# Patient Record
Sex: Male | Born: 1965 | Race: White | Hispanic: No | Smoking: Current every day smoker
Health system: Southern US, Community
[De-identification: ages and names within clinical notes are randomized; demographics above are authoritative.]

## PROBLEM LIST (undated history)

## (undated) DIAGNOSIS — F329 Major depressive disorder, single episode, unspecified: Secondary | ICD-10-CM

## (undated) DIAGNOSIS — F32A Depression, unspecified: Secondary | ICD-10-CM

## (undated) DIAGNOSIS — F319 Bipolar disorder, unspecified: Secondary | ICD-10-CM

## (undated) DIAGNOSIS — E785 Hyperlipidemia, unspecified: Secondary | ICD-10-CM

---

## 1999-09-10 ENCOUNTER — Emergency Department (HOSPITAL_COMMUNITY): Admission: EM | Admit: 1999-09-10 | Discharge: 1999-09-10 | Payer: Self-pay | Admitting: Emergency Medicine

## 2007-02-03 ENCOUNTER — Emergency Department (HOSPITAL_COMMUNITY): Admission: EM | Admit: 2007-02-03 | Discharge: 2007-02-03 | Payer: Self-pay | Admitting: Emergency Medicine

## 2008-07-31 ENCOUNTER — Inpatient Hospital Stay (HOSPITAL_COMMUNITY): Admission: AD | Admit: 2008-07-31 | Discharge: 2008-08-29 | Payer: Self-pay | Admitting: Psychiatry

## 2008-07-31 ENCOUNTER — Emergency Department (HOSPITAL_COMMUNITY): Admission: EM | Admit: 2008-07-31 | Discharge: 2008-07-31 | Payer: Self-pay | Admitting: Emergency Medicine

## 2008-07-31 ENCOUNTER — Ambulatory Visit: Payer: Self-pay | Admitting: Psychiatry

## 2009-07-10 ENCOUNTER — Emergency Department (HOSPITAL_COMMUNITY): Admission: EM | Admit: 2009-07-10 | Discharge: 2009-07-10 | Payer: Self-pay | Admitting: Emergency Medicine

## 2009-07-10 ENCOUNTER — Inpatient Hospital Stay (HOSPITAL_COMMUNITY): Admission: AD | Admit: 2009-07-10 | Discharge: 2009-07-21 | Payer: Self-pay | Admitting: Psychiatry

## 2009-07-10 ENCOUNTER — Ambulatory Visit: Payer: Self-pay | Admitting: Psychiatry

## 2009-08-31 ENCOUNTER — Emergency Department: Payer: Self-pay | Admitting: Emergency Medicine

## 2010-12-23 LAB — COMPREHENSIVE METABOLIC PANEL
ALT: 14 U/L (ref 0–53)
AST: 19 U/L (ref 0–37)
Albumin: 4.3 g/dL (ref 3.5–5.2)
CO2: 28 mEq/L (ref 19–32)
Chloride: 103 mEq/L (ref 96–112)
GFR calc Af Amer: >60 mL/min (ref >60)
GFR calc non Af Amer: 57 mL/min — ABNORMAL LOW (ref >60)
Sodium: 138 mEq/L (ref 135–145)
Total Bilirubin: 0.6 mg/dL (ref 0.3–1.2)

## 2010-12-23 LAB — RAPID URINE DRUG SCREEN, HOSP PERFORMED
Amphetamines: NOT DETECTED
Benzodiazepines: POSITIVE — AB
Tetrahydrocannabinol: POSITIVE — AB

## 2010-12-23 LAB — CBC
Platelets: 290 10*3/uL (ref 150–400)
RBC: 4.15 MIL/uL — ABNORMAL LOW (ref 4.22–5.81)
WBC: 9 10*3/uL (ref 4.0–10.5)

## 2010-12-23 LAB — ETHANOL: Alcohol, Ethyl (B): <5 mg/dL (ref 0–10)

## 2011-02-04 NOTE — Discharge Summary (Signed)
Curtis Schultz, Curtis Schultz            ACCOUNT NO.:  [id number]   MEDICAL RECORD NO.:  [id number]          PATIENT TYPE:  IPS   LOCATION:  0505                          FACILITY:  [facility]   PHYSICIAN:  Geoffery Lyons, M.D.      DATE OF BIRTH:  December 13, 1965   DATE OF ADMISSION:  07/31/2008  DATE OF DISCHARGE:  08/29/2008                               DISCHARGE SUMMARY   CHIEF COMPLAINT:  This was the first admission to [facility] for this 45 year old white male who endorsed depression on and  off for 4 years, drank half a fifth of liquor Thursday night, having  cocaine sporadically, binging, anticipating the anniversary of his son's  death in 09-13-2023.   PAST PSYCHIATRIC HISTORY:  Had been seen by his primary physician, had  been given Zyprexa, Symbyax, Prozac and Xanax, had been told to have  bipolar as well as ongoing anxiety.   ALCOHOL AND DRUG HABITS:  Has been drinking alcohol since the teenage  years.  Note, daily, but as of recently increased use with increased  tolerance.  Used marijuana during teen years regularly, only  occasionally now.   MEDICAL HISTORY:  Recent history of pneumonia.   MEDICATION:  1. Valium 5 mg 1-4 per day.  2. Prozac 40 mg per day.   PHYSICAL EXAMINATION:  Failed to show any acute findings.   LABORATORY WORK:  White blood cells 10.4, hemoglobin 12.8.  UDS positive  for cocaine, benzodiazepines and marijuana.  Other laboratory values  within normal limits.  Exam reveals an alert cooperative male.  Mood  depressed.  Affect depressed.  Actively dealing with the death of his  son, all the regrets in his life as well as hopeless, helplessness.  No  reason to live, no purpose in life.  No homicidal ideas.  Positive  suicidal thoughts.  No delusions.  No hallucinations.  Cognition well-  preserved.   ADMISSION DIAGNOSES:  AXIS I:  Major depressive disorder, rule out PTSD.  Alcohol dependence, cocaine and marijuana abuse.  AXIS II: No  diagnosis.  AXIS III:  No diagnosis.  AXIS IV: Moderate.  AXIS V:  On admission 35, [facility] in the last year 70.   COURSE IN THE HOSPITAL:  He was admitted and started on individual and  group psychotherapy.  We started detoxing with Librium.  As already  stated, in 2023-09-13 of last year, his 5 year old son died in a car  accident.  After that, has a series of 7-8 family members who have died,  had been dealing with depression on and off for 4 years.  Recently, the  depression and the anxiety got worse, drinking daily.  Had been using  cocaine from once a week to twice a day.  Alcohol since teenage years,  but increased use recently as well as marijuana.  Mother's side has  bipolar disorder.  He had been told he was bipolar.  Initially was on  Xanax 1 mg four times a day, switched to Valium.  He was initially on  Zyprexa switched to Symbyax, Prozac and Xanax.  November 16, continued  to  actively grieve the death of his son.  Does not know things are the  way they are right now.  Feels he cannot move on, not able to sleep.  Mood depressed,  Affect depressed, tearful, suicidal ruminations.  Does  not see why to go on.  We increased the Seroquel.  We added Lexapro.  November 17, continued to have a hard time dealing with the loss of the  son, coming to the first anniversary.  Lexapro in the past.  He has had  __________observation before.  He felt like a zombie.  He had some  issues with his GI tract.  He was very nauseated.  Continued to have  depressed mood, depressed affect, psychomotor retardation.  Continued  the Lexapro and started the Effexor.  November 19, he was still  endorsing suicidal ideas, but did endorse in the last 48 hours he was  starting to maybe feel a little bit better.  Still actively grieving the  death of his son.  Very worried as he gets closer to the anniversary of  his death.  Continued to work with the Effexor increased to 75.  We  worked with grief and loss and  CBT.  November 20, he heard the voice of  his son calling him at night twice, could not go back to sleep.  Had  started thinking about his son and went on all day, noticed how the milk  carton had the expiration date 12/02, and that is the day of his son's  death.  This upset him very much, tearful, having a hard time moving on.  He continued to endorse depressed mood, difficulty with sleep,  ruminating about son's death, anticipating the first anniversary.  We  worked with Effexor and Seroquel.  Sleep continued to be an issue,  waking up at 3:00 in the morning, was being tired, burned out, still  worrying about the way he was going to react when he was out of the  hospital.  Did not feel ready to go home.  We switched to Remeron salt  tab 45 mg to help with sleep as well as to augment.  He continued to  have a hard time with sleep, very aware of the holiday, Thanksgiving.  Still concerned about December 2, anniversary of his son's death, trying  to get in a situation where he can more effectively deal with the death.  Endorsed he knew he was going to have a hard time.  We gave some  Vistaril, he was able to sleep.  November 26, Thanksgiving day, he was  having a very hard time, that was the first Thanksgiving without his  son.  Pretty overwhelming, __________.  As soon as we got the sleep  under better control the next day, he was not able to sleep.  He was  tired during the days and irritable.  When not able to sleep, had a  worse day, anticipating getting worse as he gets closer to the son's  anniversary.  Would not trust himself out of the hospital.  There was a  lot of pressure from family to keep him as long as possible, as they  felt that if he was out he would not be able to handle it.  December 1,  he was tearful, talking about his son's death, actively grieving.  Did  not sleep the night before the anniversary.  Endorsed that he knew he  was going to have a very hard time.   December  3, upset with other peers  as they were not serious about the recovery.  Girlfriend did not want  him to leave until he was well.  We are trying to define well for him  and for her.  He is just wanting to be able to sleep.  We increased the  Seroquel.  He continued to worry and ruminate.  He was focusing on the  fact that if he was unable to sleep he was not going to make it.  In 48  hours, we continued to adjust the medications.  We had tried several  medications for sleep.  We went ahead and discontinue the Vistaril and  Lunesta.  He continued to say that he did abuse benzodiazepines before.  We tried Ambien.  He did not sleep through the night, but whatever he  said was more restful on Ambien.  He started feeling that he was back to  himself.  He has not been himself for a year.  Became more open in  group, assertive.  He felt that he was having his sense of self back.  Sleep continued to be an issue, but he felt so much better that he was  not focusing on sleep but the way he was feeling, which was much better  than when he was admitted.  He had dealt with the two holidays, the  first Thanksgiving without him and the first anniversary.  He had gotten  stronger, and on December 11 he was in full contact reality.  Objectively, he was much better.  Subjectively, he felt better.  Sleep  was overall better, but the feeling was that once he got home around his  surrounding that he was going to be able to have a more normal sleep.  Upon discharge, no suicidal or homicidal and much improved on admission.   DISCHARGE DIAGNOSES:  AXIS I:  Alcohol dependence, cocaine,  benzodiazepine, marijuana abuse.  Major depressive disorder versus  bipolar, depressed.  AXIS II:  No diagnosis.  AXIS III:  No diagnosis.  AXIS IV:  Moderate.  AXIS V:  On discharge 55-60.   DISCHARGE MEDICATIONS:  1. Effexor XR 150 mg per day.  2. Seroquel 800 mg at night.  3. Ambien 12.5 mg at night.  4. Remeron  45 mg at night.  5. Lamictal 100 mg per day.   FOLLOWUP:  Follow-up at __________and [facility].      Geoffery Lyons, M.D.  Electronically Signed     IL/MEDQ  D:  10/02/2008  T:  10/02/2008  Job:  [id number]

## 2011-06-21 LAB — DIFFERENTIAL
Basophils Absolute: 0 10*3/uL (ref 0.0–0.1)
Basophils Absolute: 0.1
Basophils Relative: 0 % (ref 0–1)
Basophils Relative: 1
Eosinophils Absolute: 0.1 10*3/uL (ref 0.0–0.7)
Eosinophils Absolute: 0.1
Eosinophils Relative: 1
Lymphocytes Relative: 15
Lymphs Abs: 1.3
Monocytes Absolute: 0.8 10*3/uL (ref 0.1–1.0)
Monocytes Absolute: 0.8
Monocytes Relative: 7 % (ref 3–12)
Monocytes Relative: 9
Neutro Abs: 7.6 10*3/uL (ref 1.7–7.7)
Neutro Abs: 6.6
Neutrophils Relative %: 73 % (ref 43–77)
Neutrophils Relative %: 75

## 2011-06-21 LAB — CBC
HCT: 42.6
Hemoglobin: 12.8 g/dL — ABNORMAL LOW (ref 13.0–17.0)
Hemoglobin: 14.5
MCHC: 34.5 g/dL (ref 30.0–36.0)
MCHC: 34.1
MCV: 95.7 fL (ref 78.0–100.0)
MCV: 95.9
Platelets: 301
RBC: 4.44
RDW: 12.7 % (ref 11.5–15.5)
RDW: 13.6
WBC: 8.9

## 2011-06-21 LAB — COMPREHENSIVE METABOLIC PANEL
ALT: 12
AST: 16
Albumin: 4.3
Alkaline Phosphatase: 64
BUN: 10
CO2: 29
Calcium: 9.1
Chloride: 103
Creatinine, Ser: 1.12
GFR calc Af Amer: >60
GFR calc non Af Amer: >60
Glucose, Bld: 92
Potassium: 4
Sodium: 138
Total Bilirubin: 1.4 — ABNORMAL HIGH
Total Protein: 6.3

## 2011-06-21 LAB — RAPID URINE DRUG SCREEN, HOSP PERFORMED
Amphetamines: NOT DETECTED
Barbiturates: NOT DETECTED
Cocaine: POSITIVE — AB
Opiates: NOT DETECTED
Tetrahydrocannabinol: POSITIVE — AB

## 2011-06-21 LAB — APTT: aPTT: 33 seconds (ref 24–37)

## 2011-06-21 LAB — ETHANOL: Alcohol, Ethyl (B): <5

## 2012-06-12 ENCOUNTER — Ambulatory Visit: Payer: Self-pay

## 2014-03-03 ENCOUNTER — Encounter: Payer: Self-pay | Admitting: *Deleted

## 2014-04-29 ENCOUNTER — Encounter (HOSPITAL_COMMUNITY): Payer: Self-pay | Admitting: Emergency Medicine

## 2014-04-29 ENCOUNTER — Emergency Department (HOSPITAL_COMMUNITY)
Admission: EM | Admit: 2014-04-29 | Discharge: 2014-04-29 | Disposition: A | Discharge status: Home / Self Care | Payer: Self-pay | Attending: Emergency Medicine | Admitting: Emergency Medicine

## 2014-04-29 ENCOUNTER — Emergency Department (HOSPITAL_COMMUNITY): Payer: Self-pay

## 2014-04-29 DIAGNOSIS — Z8639 Personal history of other endocrine, nutritional and metabolic disease: Secondary | ICD-10-CM | POA: Insufficient documentation

## 2014-04-29 DIAGNOSIS — R110 Nausea: Secondary | ICD-10-CM | POA: Insufficient documentation

## 2014-04-29 DIAGNOSIS — Z862 Personal history of diseases of the blood and blood-forming organs and certain disorders involving the immune mechanism: Secondary | ICD-10-CM | POA: Insufficient documentation

## 2014-04-29 DIAGNOSIS — Z8659 Personal history of other mental and behavioral disorders: Secondary | ICD-10-CM | POA: Insufficient documentation

## 2014-04-29 DIAGNOSIS — N509 Disorder of male genital organs, unspecified: Secondary | ICD-10-CM | POA: Insufficient documentation

## 2014-04-29 DIAGNOSIS — N452 Orchitis: Secondary | ICD-10-CM | POA: Insufficient documentation

## 2014-04-29 DIAGNOSIS — Z880 Allergy status to penicillin: Secondary | ICD-10-CM | POA: Insufficient documentation

## 2014-04-29 DIAGNOSIS — F17200 Nicotine dependence, unspecified, uncomplicated: Secondary | ICD-10-CM | POA: Insufficient documentation

## 2014-04-29 DIAGNOSIS — N453 Epididymo-orchitis: Secondary | ICD-10-CM

## 2014-04-29 DIAGNOSIS — E785 Hyperlipidemia, unspecified: Secondary | ICD-10-CM | POA: Insufficient documentation

## 2014-04-29 HISTORY — DX: Major depressive disorder, single episode, unspecified: F32.9

## 2014-04-29 HISTORY — DX: Bipolar disorder, unspecified: F31.9

## 2014-04-29 HISTORY — DX: Hyperlipidemia, unspecified: E78.5

## 2014-04-29 HISTORY — DX: Depression, unspecified: F32.A

## 2014-04-29 LAB — CBC
HEMATOCRIT: 38.1 % — AB (ref 39.0–52.0)
HEMOGLOBIN: 12.7 g/dL — AB (ref 13.0–17.0)
MCH: 32 pg (ref 26.0–34.0)
MCHC: 33.3 g/dL (ref 30.0–36.0)
MCV: 96 fL (ref 78.0–100.0)
Platelets: 261 10*3/uL (ref 150–400)
RBC: 3.97 MIL/uL — ABNORMAL LOW (ref 4.22–5.81)
RDW: 13.6 % (ref 11.5–15.5)
WBC: 22 10*3/uL — AB (ref 4.0–10.5)

## 2014-04-29 LAB — URINALYSIS, ROUTINE W REFLEX MICROSCOPIC
Bilirubin Urine: NEGATIVE
Glucose, UA: NEGATIVE mg/dL
Ketones, ur: 15 mg/dL — AB
Nitrite: POSITIVE — AB
Protein, ur: 30 mg/dL — AB
Specific Gravity, Urine: 1.023 (ref 1.005–1.030)
Urobilinogen, UA: 0.2 mg/dL (ref 0.0–1.0)
pH: 5.5 (ref 5.0–8.0)

## 2014-04-29 LAB — URINE MICROSCOPIC-ADD ON

## 2014-04-29 LAB — I-STAT CHEM 8, ED
BUN: 17 mg/dL (ref 6–23)
Calcium, Ion: 1.13 mmol/L (ref 1.12–1.23)
Chloride: 101 mEq/L (ref 96–112)
Creatinine, Ser: 1.1 mg/dL (ref 0.50–1.35)
Glucose, Bld: 112 mg/dL — ABNORMAL HIGH (ref 70–99)
HCT: 41 % (ref 39.0–52.0)
Hemoglobin: 13.9 g/dL (ref 13.0–17.0)
Potassium: 4.5 mEq/L (ref 3.7–5.3)
Sodium: 134 mEq/L — ABNORMAL LOW (ref 137–147)
TCO2: 22 mmol/L (ref 0–100)

## 2014-04-29 MED ORDER — HYDROCODONE-ACETAMINOPHEN 5-325 MG PO TABS: 1.0000 | ORAL_TABLET | ORAL | Status: AC | PRN

## 2014-04-29 MED ORDER — SULFAMETHOXAZOLE-TRIMETHOPRIM 800-160 MG PO TABS: 1.0000 | ORAL_TABLET | Freq: Two times a day (BID) | ORAL | Status: AC

## 2014-04-29 MED ORDER — KETOROLAC TROMETHAMINE 30 MG/ML IJ SOLN
30.0000 mg | Freq: Once | INTRAMUSCULAR | Status: AC
  Administered 2014-04-29: 30 mg via INTRAVENOUS
  Filled 2014-04-29: qty 1

## 2014-04-29 MED ORDER — MORPHINE SULFATE 4 MG/ML IJ SOLN
4.0000 mg | Freq: Once | INTRAMUSCULAR | Status: AC
  Administered 2014-04-29: 4 mg via INTRAVENOUS
  Filled 2014-04-29: qty 1

## 2014-04-29 MED ORDER — AZITHROMYCIN 250 MG PO TABS
1000.0000 mg | ORAL_TABLET | Freq: Once | ORAL | Status: AC
Start: 2014-04-29 — End: 2014-04-29
  Administered 2014-04-29: 1000 mg via ORAL
  Filled 2014-04-29: qty 4

## 2014-04-29 MED ORDER — ONDANSETRON HCL 4 MG/2ML IJ SOLN
4.0000 mg | Freq: Once | INTRAMUSCULAR | Status: AC
  Administered 2014-04-29: 4 mg via INTRAVENOUS
  Filled 2014-04-29: qty 2

## 2014-04-29 MED ORDER — SODIUM CHLORIDE 0.9 % IV BOLUS (SEPSIS)
1000.0000 mL | Freq: Once | INTRAVENOUS | Status: AC
  Administered 2014-04-29: 1000 mL via INTRAVENOUS

## 2014-04-29 MED ORDER — SULFAMETHOXAZOLE-TMP DS 800-160 MG PO TABS
1.0000 | ORAL_TABLET | Freq: Once | ORAL | Status: AC
  Administered 2014-04-29: 1 via ORAL
  Filled 2014-04-29: qty 1

## 2014-04-29 MED ORDER — HYDROMORPHONE HCL PF 1 MG/ML IJ SOLN
1.0000 mg | Freq: Once | INTRAMUSCULAR | Status: AC
  Administered 2014-04-29: 1 mg via INTRAVENOUS
  Filled 2014-04-29: qty 1

## 2014-04-29 MED ORDER — DOXYCYCLINE HYCLATE 100 MG PO CAPS: 100.0000 mg | ORAL_CAPSULE | Freq: Two times a day (BID) | ORAL | Status: AC

## 2014-04-29 MED ORDER — DOXYCYCLINE HYCLATE 100 MG PO TABS
100.0000 mg | ORAL_TABLET | Freq: Once | ORAL | Status: AC
  Administered 2014-04-29: 100 mg via ORAL
  Filled 2014-04-29: qty 1

## 2014-04-29 NOTE — ED Notes (Signed)
Pt requesting pain medication.  Sts previous dose of medication did not touch his pain at all.

## 2014-04-29 NOTE — Discharge Instructions (Signed)
You were found to have a urinary tract infection that has spread to your epididymus and testicle area. Please use the antibiotics prescribed and follow up with a primary care provider or urology specialist for continued evaluation and treatment. Return for any worsening or changing symptoms.   Epididymitis Epididymitis is a swelling (inflammation) of the epididymis. The epididymis is a cord-like structure along the back part of the testicle. Epididymitis is usually, but not always, caused by infection. This is usually a sudden problem beginning with chills, fever and pain behind the scrotum and in the testicle. There may be swelling and redness of the testicle. DIAGNOSIS  Physical examination will reveal a tender, swollen epididymis. Sometimes, cultures are obtained from the urine or from prostate secretions to help find out if there is an infection or if the cause is a different problem. Sometimes, blood work is performed to see if your white blood cell count is elevated and if a germ (bacterial) or viral infection is present. Using this knowledge, an appropriate medicine which kills germs (antibiotic) can be chosen by your caregiver. A viral infection causing epididymitis will most often go away (resolve) without treatment. HOME CARE INSTRUCTIONS   Hot sitz baths for 20 minutes, 4 times per day, may help relieve pain.  Only take over-the-counter or prescription medicines for pain, discomfort or fever as directed by your caregiver.  Take all medicines, including antibiotics, as directed. Take the antibiotics for the full prescribed length of time even if you are feeling better.  It is very important to keep all follow-up appointments. SEEK IMMEDIATE MEDICAL CARE IF:   You have a fever.  You have pain not relieved with medicines.  You have any worsening of your problems.  Your pain seems to come and go.  You develop pain, redness, and swelling in the scrotum and surrounding areas. MAKE  SURE YOU:   Understand these instructions.  Will watch your condition.  Will get help right away if you are not doing well or get worse. Document Released: 09/02/2000 Document Revised: 11/28/2011 Document Reviewed: 07/23/2009 [facility] Patient Information 2015 [place], [place]. This information is not intended to replace advice given to you by your health care provider. Make sure you discuss any questions you have with your health care provider.    Orchitis Orchitis is an infection of the testicle of usually sudden onset (happens quickly). It may be viral or bacterial (caused by germs). Usually with this illness there is generalized malaise (not feeling well) and fever. There is also pain. There is usually tenderness and swelling of the scrotum and testicle. DIAGNOSIS  Your caregiver will perform an exam to make sure there is not another reason for the pain in your testicle. A rectal exam may be done to find out if the prostate is swollen and tender. Blood work may be done to see if your white blood cell count is elevated. This can help determine if an infection is viral or bacterial. A urinalysis can also determine what type of infection is present. Most bacterial infections can be treated with antibiotics (medications which kill germs). LET YOUR CAREGIVER KNOW ABOUT:  Allergies.  Medications taken including herbs, eye drops, over the counter medications, and creams.  Use of steroids (by mouth or creams).  Previous problems with anesthetics or novocaine.  Previous prostate infections.  History of blood clots (thrombophlebitis).  History of bleeding or blood problems.  Previous surgery.  Previous urinary tract infection.  Other health problems. HOME CARE INSTRUCTIONS  Apply cold packs to the scrotal area for twenty minutes, four times per day or as needed.  A scrotal support may be helpful. Keep a small pillow or support under your testicles while lying or sitting  down.  Only take over-the-counter or prescription medicines for pain, discomfort, or fever as directed by your caregiver.  Take all medications, including antibiotics, as directed. Take the antibiotics for the full prescribed length of time even if you are feeling better. SEEK IMMEDIATE MEDICAL CARE IF:   Your redness, swelling, or pain in the testicle increases or is not getting better.  You have a fever.  You have pain not relieved with medicines.  You have any worsening of any symptoms (problems) that originally brought you in for medical care. Document Released: 09/02/2000 Document Revised: 11/28/2011 Document Reviewed: 09/05/2005 [facility] Patient Information 2015 [place], [place]. This information is not intended to replace advice given to you by your health care provider. Make sure you discuss any questions you have with your health care provider.

## 2014-04-29 NOTE — ED Notes (Signed)
Pt reports he injured his L testicle on Saturday and it has increasingly been getting larger. sts it is 3 times the size it normally is. Reports severe pain running up into L groin and running fever today.

## 2014-04-29 NOTE — ED Provider Notes (Signed)
Medical screening examination/treatment/procedure(s) were performed by non-physician practitioner and as supervising physician I was immediately available for consultation/collaboration.   EKG Interpretation None       Curtis Schultz K Lacresha Fusilier-Rasch, MD 04/29/14 [phone]
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
## 2014-04-30 LAB — GC/CHLAMYDIA PROBE AMP
CT Probe RNA: NEGATIVE
GC Probe RNA: NEGATIVE

## 2014-05-01 LAB — URINE CULTURE

## 2014-05-02 ENCOUNTER — Telehealth (HOSPITAL_BASED_OUTPATIENT_CLINIC_OR_DEPARTMENT_OTHER): Payer: Self-pay | Admitting: Emergency Medicine

## 2014-05-02 NOTE — Telephone Encounter (Signed)
Post ED Visit - Positive Culture Follow-up  Culture report reviewed by antimicrobial stewardship pharmacist: []  Wes Dulaney, Pharm.D., BCPS []  Celedonio MiyamotoJeremy Frens, Pharm.D., BCPS []  Georgina PillionElizabeth Martin, Pharm.D., BCPS []  Raymond CityMinh Pham, [address] Rainbow BoulevardPharm.D., BCPS, AAHIVP []  Estella HuskMichelle Turner, Pharm.D., BCPS, AAHIVP []  Red ChristiansSamson Lee, Pharm.D. [x]  Tennis Mustassie Stewart, VermontPharm.D.  Positive urine culture >100,000 colonies/ml E. Coli Treated with Doxycycline Hyclate 100mg  po caps: take one capsule bid x 14 days,Sulfamethoxazole-trimethoprim 800-160mg  po tabs, take one tablet by mouth bid x 14 days, organism sensitive to the same and no further patient follow-up is required at this time.  Berle MullMiller, Lugene Hitt 05/02/2014, 10:36 AM

## 2015-04-22 IMAGING — US US SCROTUM
1 series · 13 of 25 positions shown · non-contrast
Comparison: No priors.

CLINICAL DATA: Testicular pain for the past 2 days. Evaluate for
potential torsion. Fever.

EXAM:
SCROTAL ULTRASOUND
DOPPLER ULTRASOUND OF THE TESTICLES
TECHNIQUE: Complete ultrasound examination of the testicles, epididymis, and
other scrotal structures was performed. Color and spectral Doppler
ultrasound were also utilized to evaluate blood flow to the
testicles.

[Series 1: us scrotum · 0.07mm/px · 63 acquisitions, 13 frames shown]
[im 1/63]
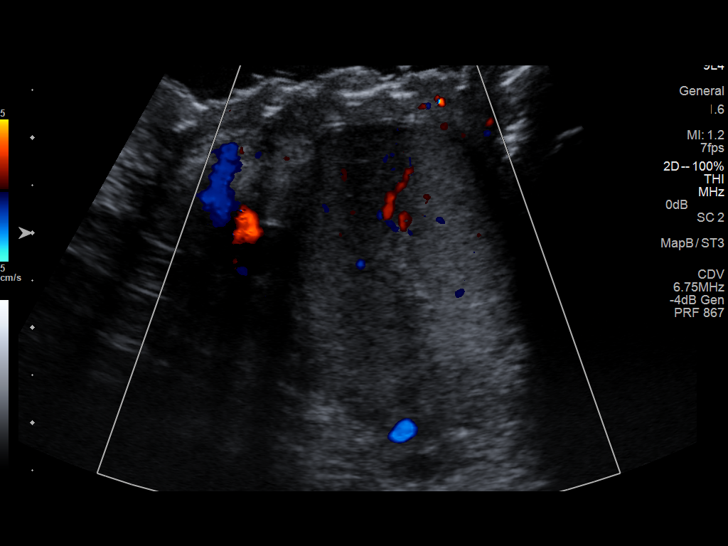
[im 6/63]
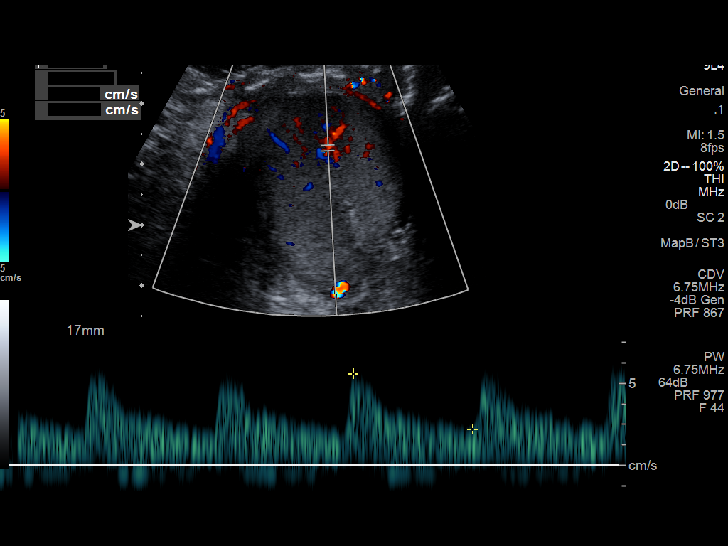
[im 11/63]
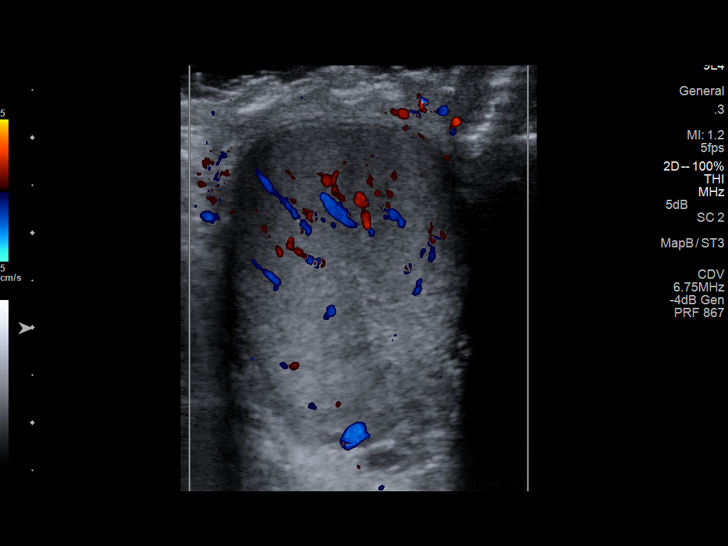
[im 16/63]
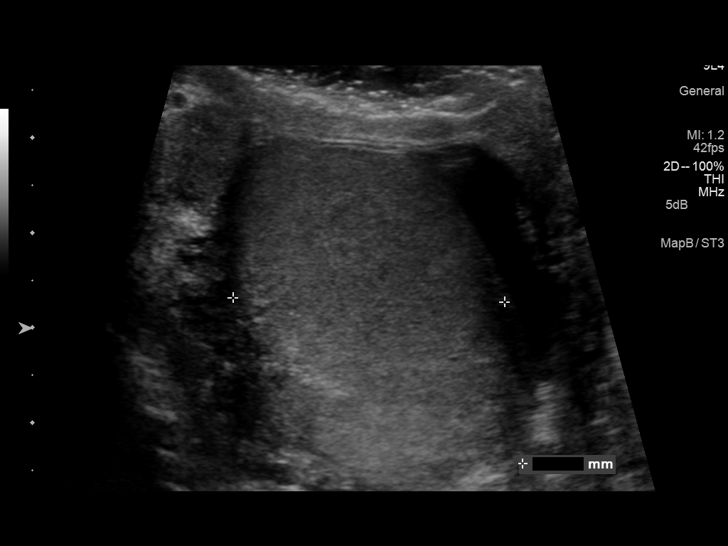
[im 21/63]
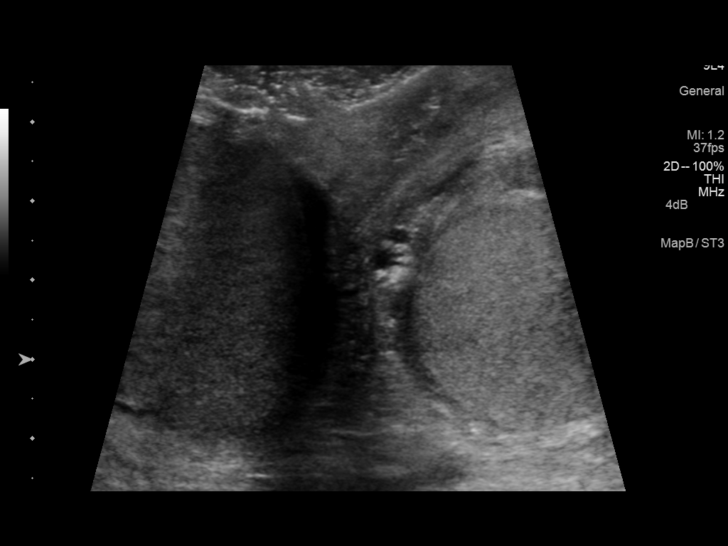
[im 26/63]
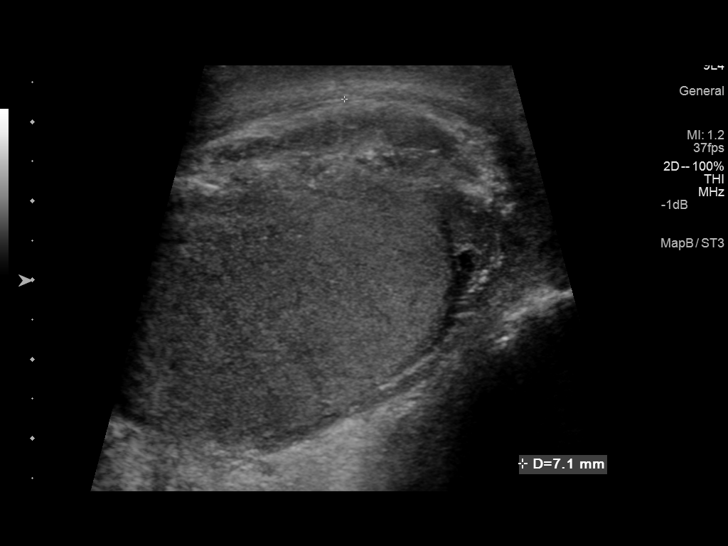
[im 32/63]
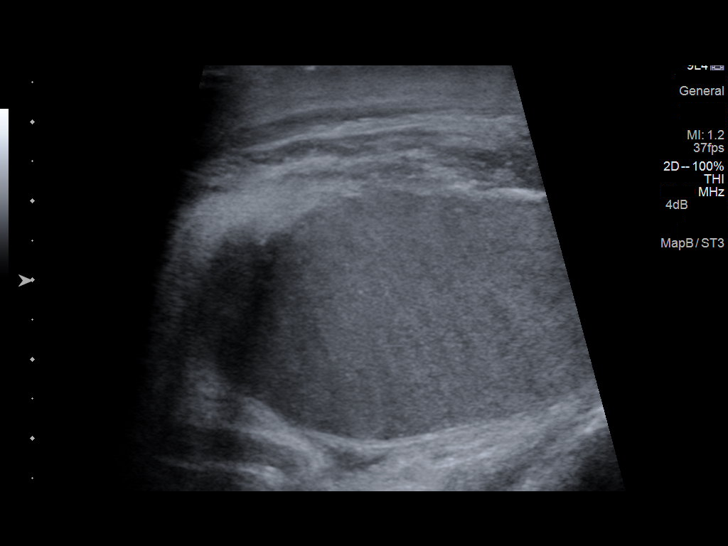
[im 37/63]
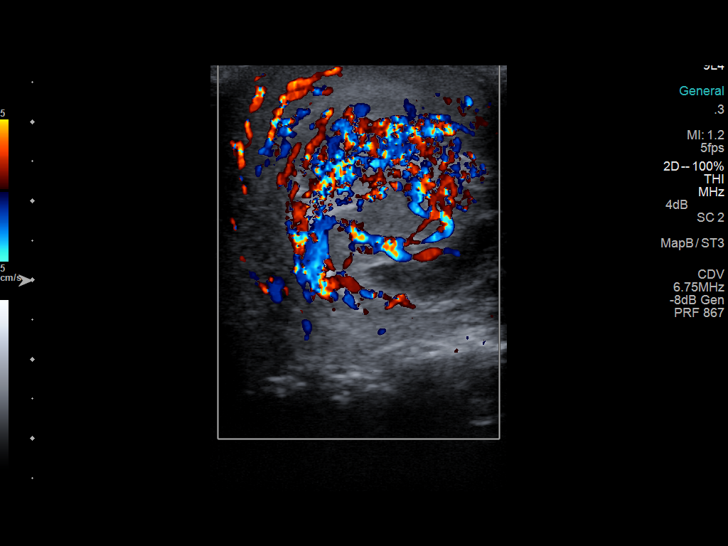
[im 42/63]
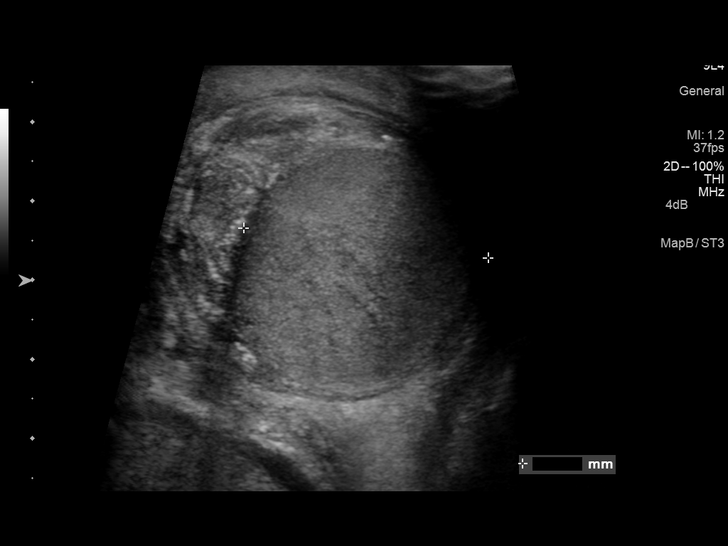
[im 47/63]
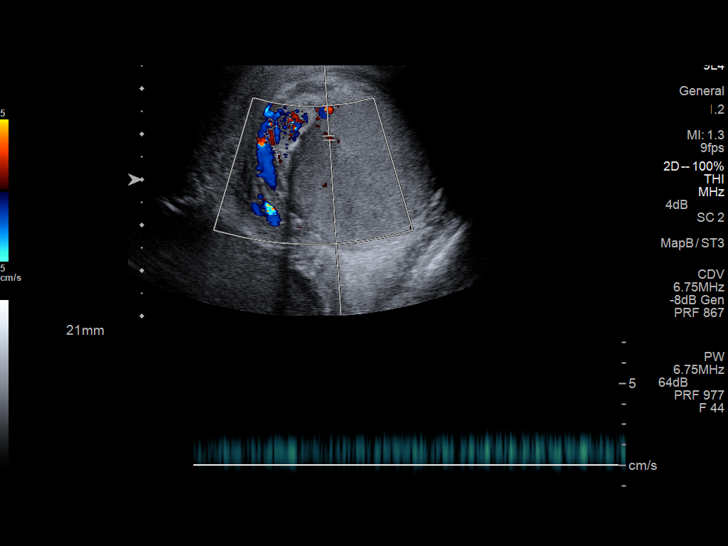
[im 52/63]
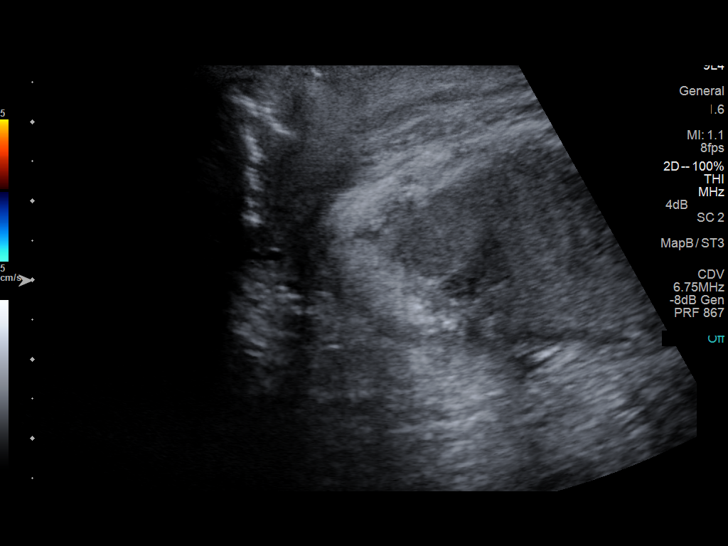
[im 57/63]
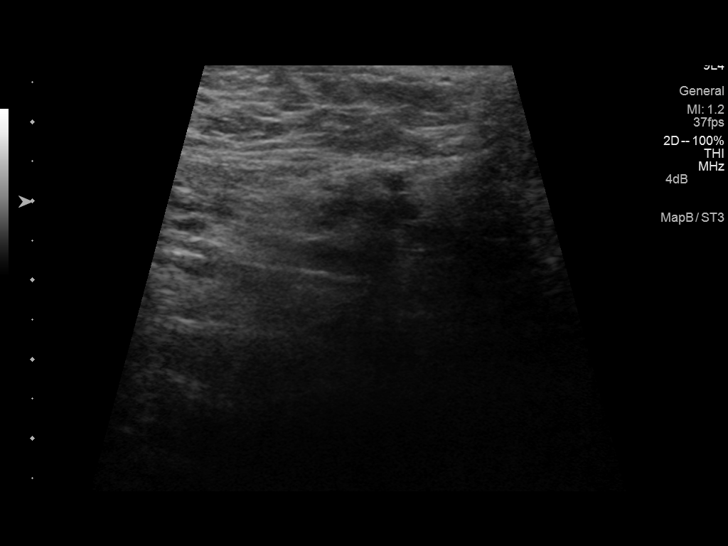
[im 63/63]
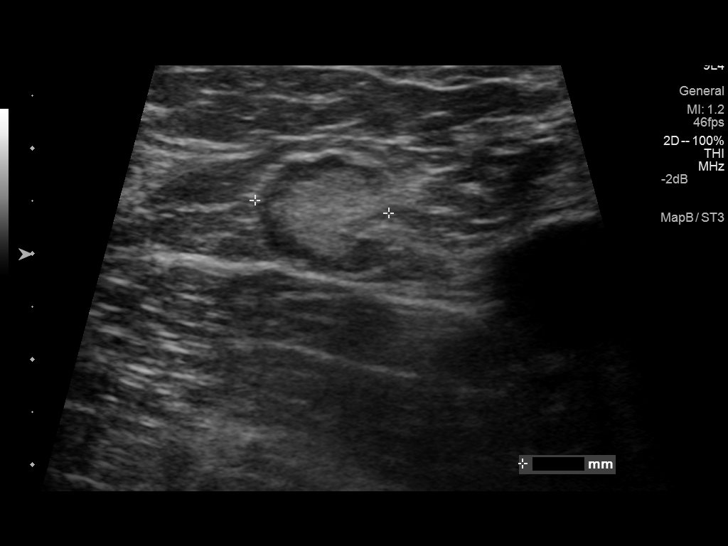

[13 of 25 positions shown; findings below may reference images not displayed]

FINDINGS: Right testicle

Measurements: 3.5 x 2.5 x 2.9 cm. No mass or microlithiasis
visualized.

Left testicle

Measurements: 5.0 x 3.3 x 3.1 cm. No mass or microlithiasis
visualized. Echotexture is slightly inhomogeneous, and blood flow
appears increased compared to the contralateral testicle.

Right epididymis: Normal in size. 1.8 x 1.9 mm anechoic lesion with
increased through transmission, compatible with a tiny epididymal
cyst or spermatocele.

Left epididymis: Appears enlarged and heterogeneous in echotexture,
with diffusely increased flow on color Doppler imaging.

Hydrocele:  Small right hydrocele.

Varicocele:  None visualized.

Pulsed Doppler interrogation of both testes demonstrates low
resistance arterial and venous waveforms bilaterally.

Additional Findings: Several reactive lymph nodes in the left
inguinal region, largest of which measures 13 mm in short axis
(these have a normal appearing fatty hila). Thickening of the left
scrotal skin.
IMPRESSION: 1. No evidence of testicular torsion.
2. However, there is enlargement and heterogeneous appearance of the
left epididymis, and hypervascularity of the left epididymis and
left testicle, indicative of left-sided epididymo-orchitis.
3. Small right hydrocele.

## 2023-03-21 DIAGNOSIS — Z87891 Personal history of nicotine dependence: Secondary | ICD-10-CM | POA: Diagnosis not present

## 2023-03-21 DIAGNOSIS — Z87892 Personal history of anaphylaxis: Secondary | ICD-10-CM | POA: Diagnosis not present

## 2023-03-21 DIAGNOSIS — Z880 Allergy status to penicillin: Secondary | ICD-10-CM | POA: Diagnosis not present

## 2024-04-05 ENCOUNTER — Encounter: Payer: Self-pay | Admitting: Advanced Practice Midwife
